# Patient Record
Sex: Male | Born: 1980 | Race: White | Hispanic: No | Marital: Married | Smoking: Current every day smoker
Health system: Southern US, Community
[De-identification: ages and names within clinical notes are randomized; demographics above are authoritative.]

## PROBLEM LIST (undated history)

## (undated) DIAGNOSIS — F319 Bipolar disorder, unspecified: Secondary | ICD-10-CM

## (undated) DIAGNOSIS — F419 Anxiety disorder, unspecified: Secondary | ICD-10-CM

---

## 2020-01-09 ENCOUNTER — Emergency Department (HOSPITAL_COMMUNITY)
Admission: EM | Admit: 2020-01-09 | Discharge: 2020-01-10 | Disposition: A | Discharge status: Home / Self Care | Payer: Self-pay | Attending: Emergency Medicine | Admitting: Emergency Medicine

## 2020-01-09 ENCOUNTER — Other Ambulatory Visit: Payer: Self-pay

## 2020-01-09 ENCOUNTER — Encounter (HOSPITAL_COMMUNITY): Payer: Self-pay | Admitting: Emergency Medicine

## 2020-01-09 ENCOUNTER — Emergency Department (HOSPITAL_COMMUNITY): Payer: Self-pay

## 2020-01-09 DIAGNOSIS — Y9372 Activity, wrestling: Secondary | ICD-10-CM | POA: Insufficient documentation

## 2020-01-09 DIAGNOSIS — F17210 Nicotine dependence, cigarettes, uncomplicated: Secondary | ICD-10-CM | POA: Insufficient documentation

## 2020-01-09 DIAGNOSIS — M25571 Pain in right ankle and joints of right foot: Secondary | ICD-10-CM | POA: Insufficient documentation

## 2020-01-09 HISTORY — DX: Bipolar disorder, unspecified: F31.9

## 2020-01-09 HISTORY — DX: Anxiety disorder, unspecified: F41.9

## 2020-01-09 NOTE — ED Triage Notes (Signed)
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 Denies any injury to the ankle.

## 2020-01-10 NOTE — ED Provider Notes (Signed)
[facility] EMERGENCY DEPARTMENT Provider Note   CSN: [id number] Arrival date & time: 01/09/20  2045   Time seen 11:55 PM  History Chief Complaint  Patient presents with  . Ankle Pain    Jason Turner is a 39 y.o. male.  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  He complains of pain over the lateral malleolus of his right ankle.  He states it is very painful to bear weight.  Patient states he had an old shoulder injury and has an orthopedist he is has seen in [place] before.  PCP Patient, No Pcp Per   Past Medical History:  Diagnosis Date  . Anxiety   . Bipolar 1 disorder (HCC)     There are no problems to display for this patient.    The histories are not reviewed yet. Please review them in the "History" navigator section and refresh this SmartLink.     No family history on file.  Social History   Tobacco Use  . Smoking status: Current Every Day Smoker    Packs/day: 1.00  Substance Use Topics  . Alcohol use: Not Currently  . Drug use: Not on file    Home Medications Prior to Admission medications   Not on File    Allergies    Penicillins  Review of Systems   Review of Systems  All other systems reviewed and are negative.   Physical Exam Updated Vital Signs BP 136/90 (BP Location: Right Arm)   Pulse (!) 111   Temp 98.4 F (36.9 C) (Oral)   Resp 20   Ht 5\' 10"  (1.778 m)   Wt 79.4 kg   SpO2 98%   BMI 25.11 kg/m   Physical Exam Vitals and nursing note reviewed.  Constitutional:      Appearance: Normal appearance. He is normal weight.  HENT:     Head: Normocephalic and atraumatic.     Right Ear: External ear normal.     Left Ear: External ear normal.  Eyes:     Extraocular Movements: Extraocular movements intact.     Conjunctiva/sclera: Conjunctivae normal.     Pupils: Pupils  are equal, round, and reactive to light.  Cardiovascular:     Rate and Rhythm: Normal rate.  Pulmonary:     Effort: Pulmonary effort is normal. No respiratory distress.  Musculoskeletal:        General: Swelling present. No tenderness.     Cervical back: Normal range of motion.     Comments: Patient has marked swelling and bruising around his lateral right ankle with some hematoma seen along the distal lateral foot.  However when I palpate his malleolus or his foot he is nontender.  He states it mainly hurts when he bears weight.  He is nontender in his foot when I palpate the metatarsals.  He states it hurts over the lateral malleolus.  Skin:    General: Skin is warm and dry.  Neurological:     General: No focal deficit present.     Mental Status: He is alert and oriented to person, place, and time.     Cranial Nerves: No cranial nerve deficit.  Psychiatric:        Mood and Affect: Mood normal.        Behavior: Behavior normal.  Thought Content: Thought content normal.       ED Results / Procedures / Treatments   Labs (all labs ordered are listed, but only abnormal results are displayed) Labs Reviewed - No data to display  EKG None  Radiology DG Ankle Complete Right  Result Date: 01/09/2020 CLINICAL DATA:  Pain EXAM: RIGHT ANKLE - COMPLETE 3+ VIEW COMPARISON:  None. FINDINGS: There is no evidence of fracture, dislocation, or joint effusion. There is no evidence of arthropathy or other focal bone abnormality. Soft tissues are unremarkable. IMPRESSION: Negative. Electronically Signed   By: Constance Holster M.D.   On: 01/09/2020 23:14    Procedures Procedures (including critical care time)  Medications Ordered in ED Medications - No data to display  ED Course  I have reviewed the triage vital signs and the nursing notes.  Pertinent labs & imaging results that were available during my care of the patient were reviewed by me and considered in my medical decision  making (see chart for details).    MDM Rules/Calculators/A&P                      Patient was placed in crutches and an ASO was applied.  He states there is an orthopedic group he has seen in [place] before.  They took care of his shoulder sprain that he had.   Final Clinical Impression(s) / ED Diagnoses Final diagnoses:  Acute right ankle pain    Rx / DC Orders ED Discharge Orders    None    OTC ibuprofen and acetaminophen  Plan discharge  Rolland Porter, MD, Barbette Or, MD 01/10/20 [phone]

## 2020-01-10 NOTE — Discharge Instructions (Signed)
Elevate your foot, use ice packs to help with the bruising and swelling, will also help with pain.  Take ibuprofen 600 mg plus acetaminophen 650 mg every 6 hours for pain.  Use the crutches until you are able to bear weight on that leg.  Wear the ankle support until you could be rechecked by an orthopedist.  You can call Dr. Romeo Apple, the orthopedist in [place] or see the orthopedic group in [place] that you saw before.

## 2022-01-01 IMAGING — DX DG ANKLE COMPLETE 3+V*R*
3 series · 3 of 3 positions shown · non-contrast
Comparison: None.

CLINICAL DATA: Pain

EXAM:
RIGHT ANKLE - COMPLETE 3+ VIEW

[ankle ap]
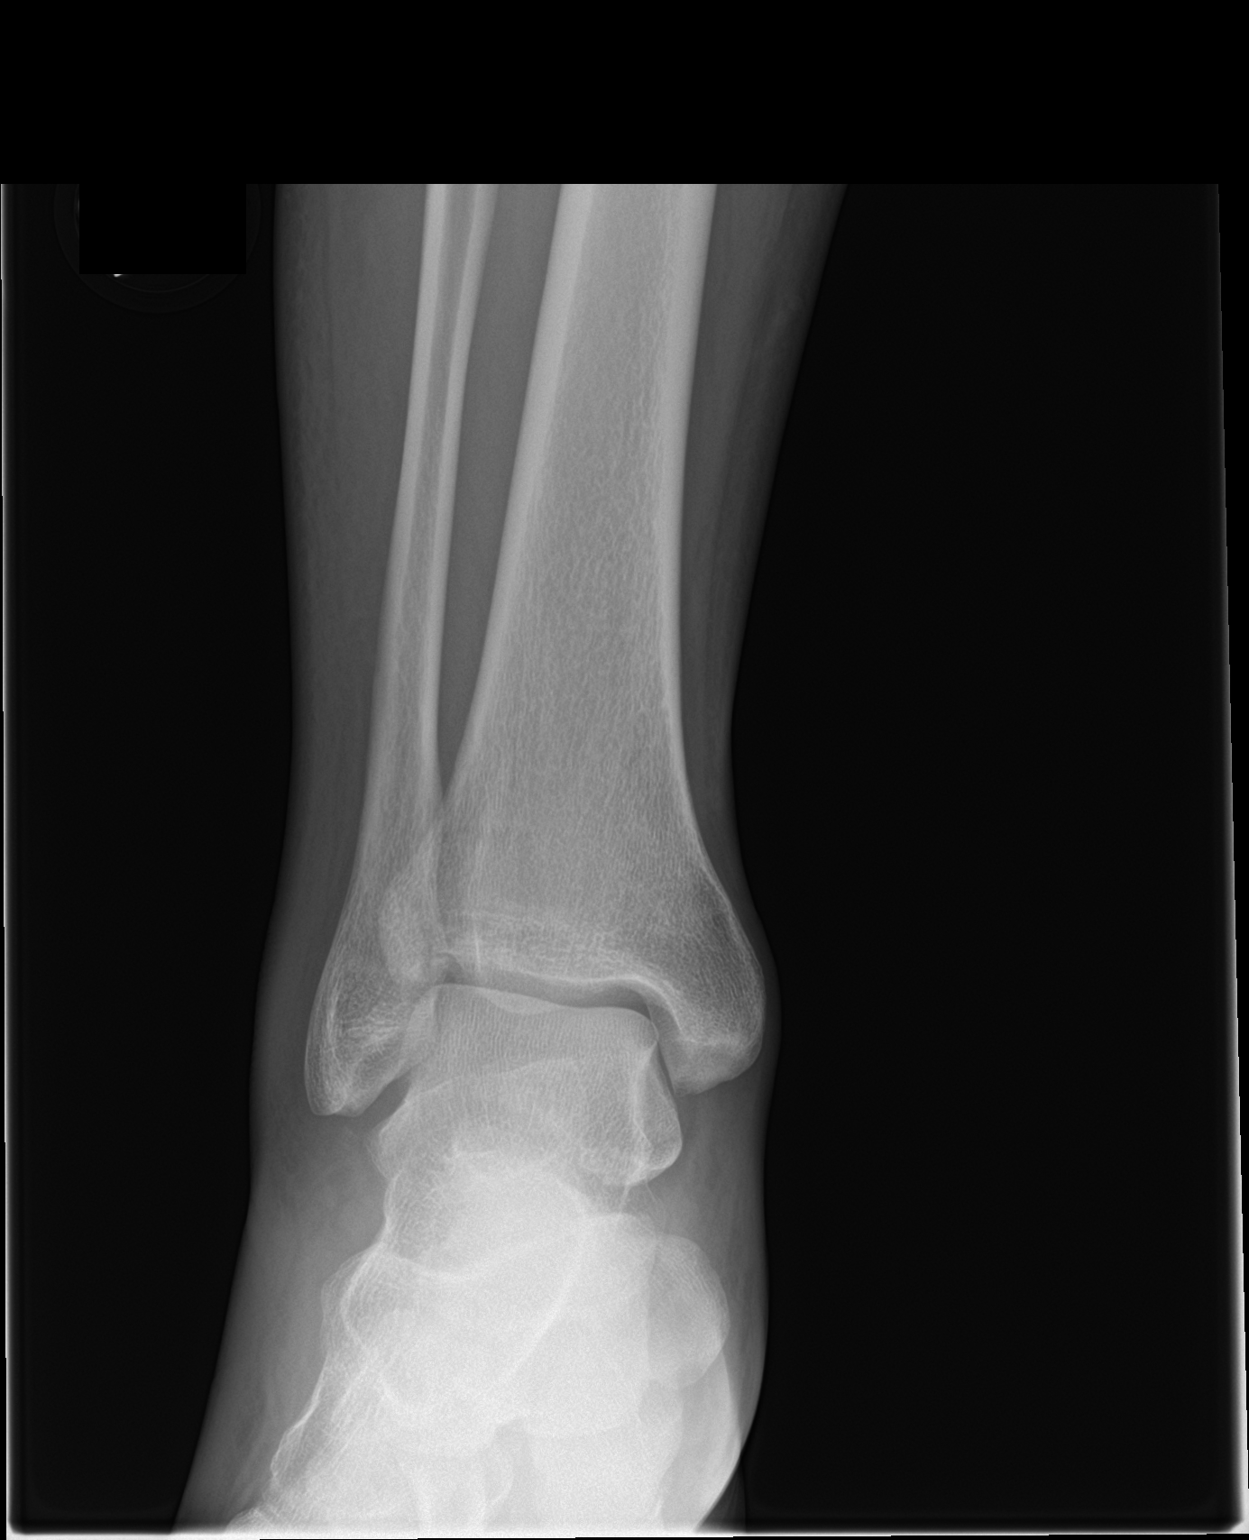

[ankle obl]
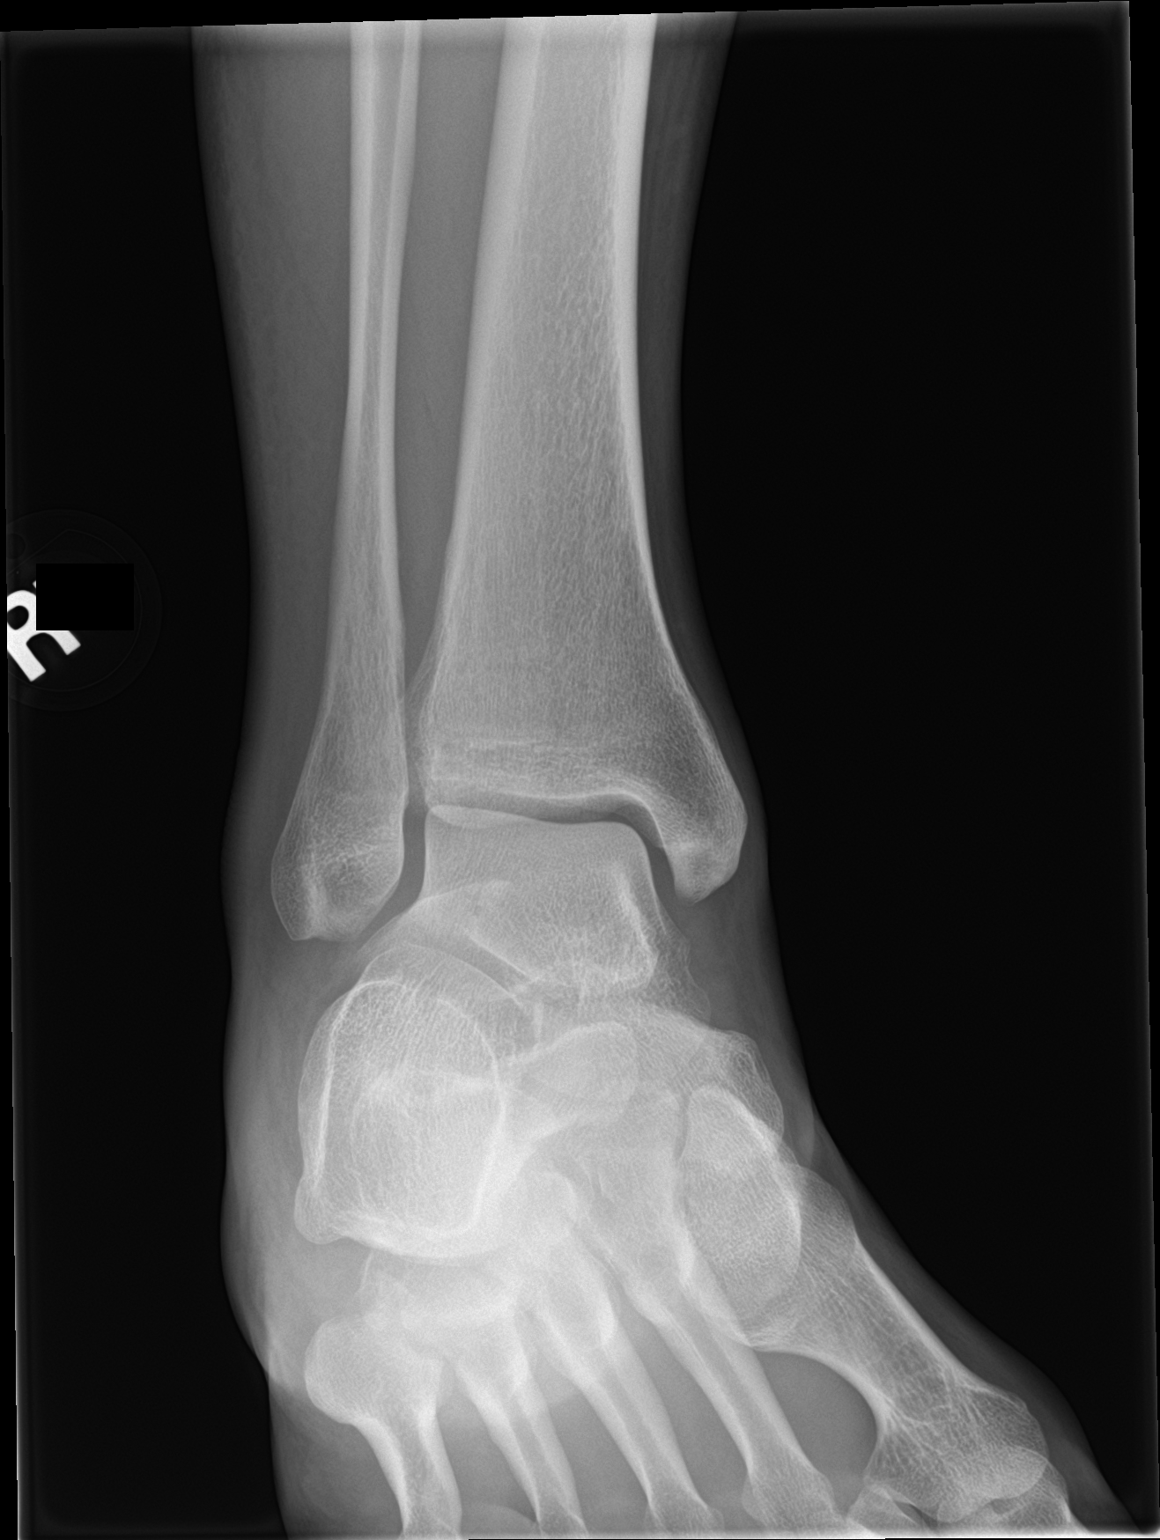

[ankle lat]
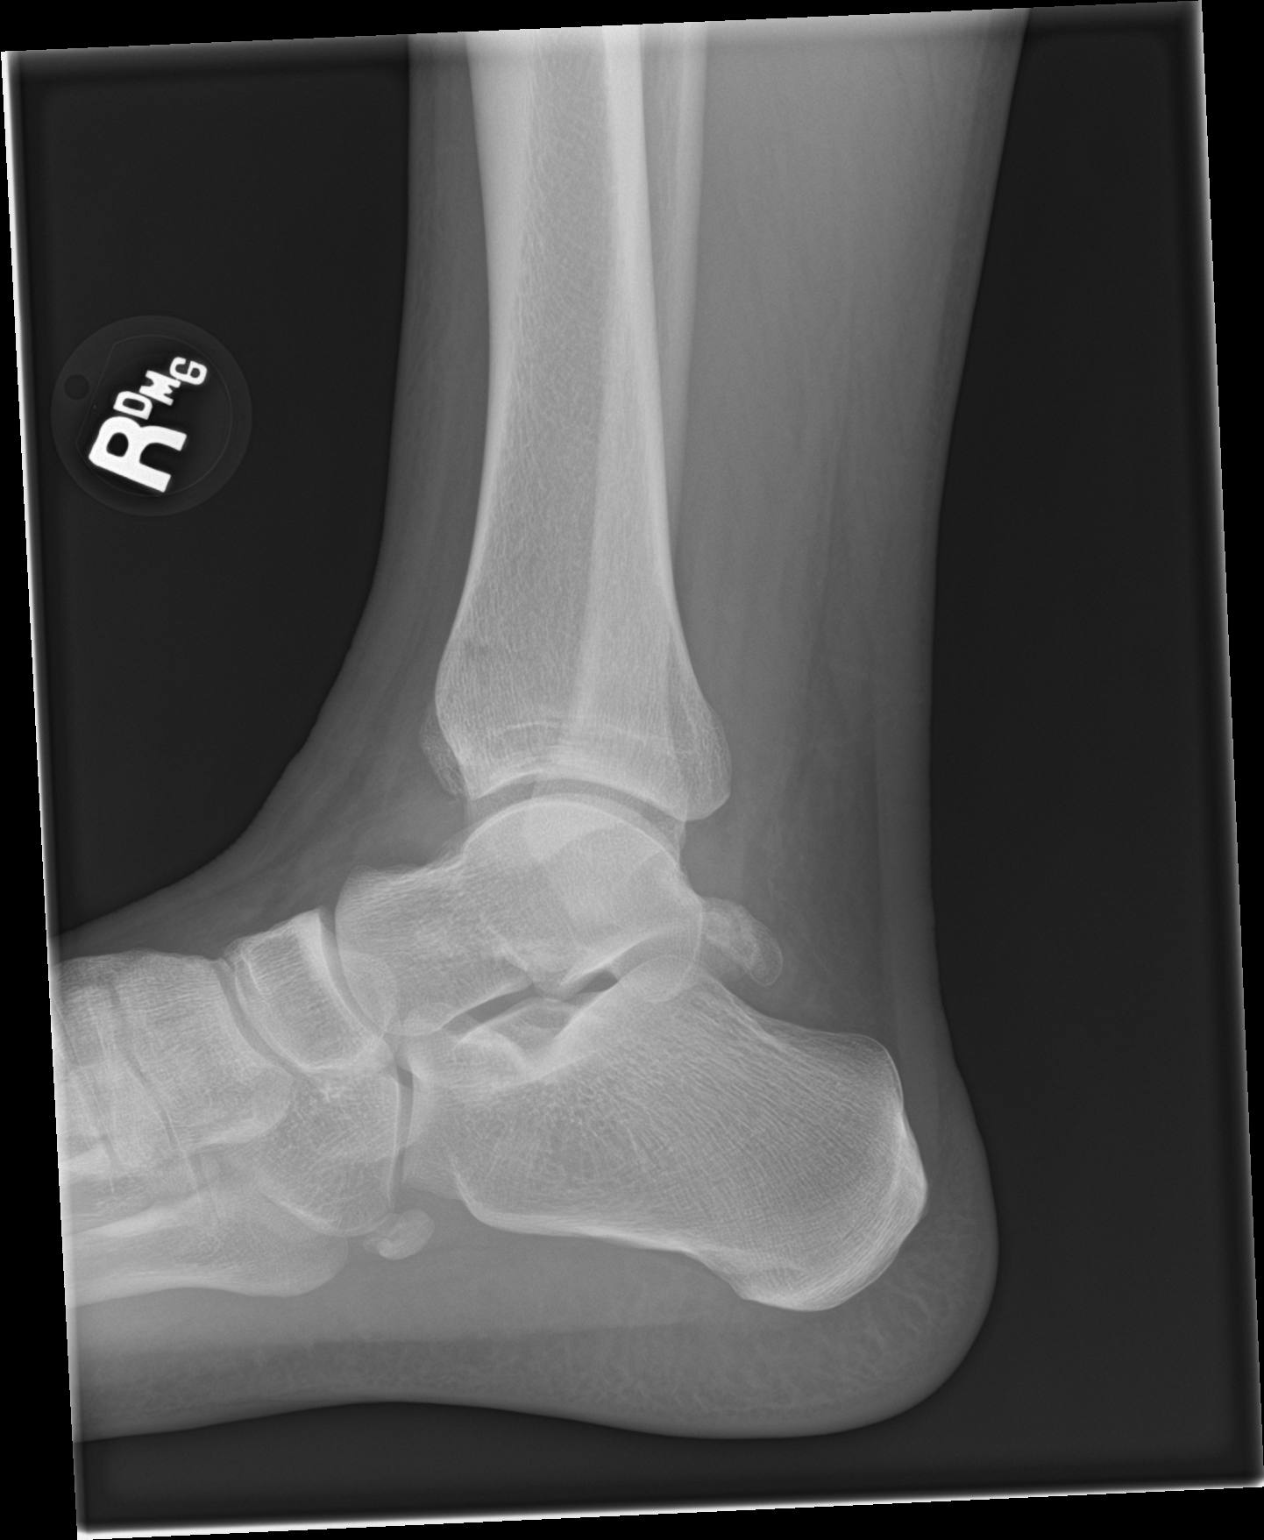

[3 of 3 positions shown; findings below may reference images not displayed]

FINDINGS: There is no evidence of fracture, dislocation, or joint effusion.
There is no evidence of arthropathy or other focal bone abnormality.
Soft tissues are unremarkable.
IMPRESSION: Negative.

## 2023-10-27 ENCOUNTER — Emergency Department: Payer: Self-pay

## 2023-10-27 ENCOUNTER — Emergency Department
Admission: EM | Admit: 2023-10-27 | Discharge: 2023-10-27 | Disposition: A | Discharge status: Home / Self Care | Payer: Self-pay | Attending: Emergency Medicine | Admitting: Emergency Medicine

## 2023-10-27 DIAGNOSIS — J101 Influenza due to other identified influenza virus with other respiratory manifestations: Secondary | ICD-10-CM | POA: Insufficient documentation

## 2023-10-27 DIAGNOSIS — R000 Tachycardia, unspecified: Secondary | ICD-10-CM | POA: Insufficient documentation

## 2023-10-27 LAB — COMPREHENSIVE METABOLIC PANEL
ALT: 16 U/L (ref 0–44)
AST: 20 U/L (ref 15–41)
Albumin: 4.4 g/dL (ref 3.5–5.0)
Alkaline Phosphatase: 50 U/L (ref 38–126)
Anion gap: 8 (ref 5–15)
BUN: 13 mg/dL (ref 6–20)
CO2: 23 mmol/L (ref 22–32)
Calcium: 9.4 mg/dL (ref 8.9–10.3)
Chloride: 103 mmol/L (ref 98–111)
Creatinine, Ser: 1.07 mg/dL (ref 0.61–1.24)
GFR, Estimated: >60 mL/min (ref >60)
Glucose, Bld: 104 mg/dL — ABNORMAL HIGH (ref 70–99)
Potassium: 3.6 mmol/L (ref 3.5–5.1)
Sodium: 134 mmol/L — ABNORMAL LOW (ref 135–145)
Total Bilirubin: 0.8 mg/dL (ref 0.0–1.2)
Total Protein: 7.4 g/dL (ref 6.5–8.1)

## 2023-10-27 LAB — CBC
HCT: 44.3 % (ref 39.0–52.0)
Hemoglobin: 15.3 g/dL (ref 13.0–17.0)
MCH: 29.5 pg (ref 26.0–34.0)
MCHC: 34.5 g/dL (ref 30.0–36.0)
MCV: 85.5 fL (ref 80.0–100.0)
Platelets: 214 10*3/uL (ref 150–400)
RBC: 5.18 MIL/uL (ref 4.22–5.81)
RDW: 13.1 % (ref 11.5–15.5)
WBC: 6.9 10*3/uL (ref 4.0–10.5)
nRBC: 0 % (ref 0.0–0.2)

## 2023-10-27 LAB — MAGNESIUM: Magnesium: 1.7 mg/dL (ref 1.7–2.4)

## 2023-10-27 LAB — RESP PANEL BY RT-PCR (RSV, FLU A&B, COVID)  RVPGX2
Influenza A by PCR: POSITIVE — AB
Influenza B by PCR: NEGATIVE
Resp Syncytial Virus by PCR: NEGATIVE
SARS Coronavirus 2 by RT PCR: NEGATIVE

## 2023-10-27 LAB — TROPONIN I (HIGH SENSITIVITY): Troponin I (High Sensitivity): 4 ng/L (ref <18)

## 2023-10-27 MED ORDER — ACETAMINOPHEN 500 MG PO TABS
1000.0000 mg | ORAL_TABLET | Freq: Once | ORAL | Status: AC
  Administered 2023-10-27: 1000 mg via ORAL
  Filled 2023-10-27: qty 2

## 2023-10-27 MED ORDER — LACTATED RINGERS IV BOLUS
1000.0000 mL | Freq: Once | INTRAVENOUS | Status: AC
  Administered 2023-10-27: 1000 mL via INTRAVENOUS

## 2023-10-27 MED ORDER — IBUPROFEN 400 MG PO TABS
400.0000 mg | ORAL_TABLET | Freq: Once | ORAL | Status: AC
  Administered 2023-10-27: 400 mg via ORAL
  Filled 2023-10-27: qty 1

## 2023-10-27 NOTE — ED Triage Notes (Signed)
 Pt to ED POV from home with tachycardia and generalized weakness for the last few hours. Pt endorses generalized body aches, fevers, and cough/congestion. Denies SOB. A&O x4 with mild labored breathing in triage.

## 2023-10-27 NOTE — ED Provider Notes (Signed)
 [facility] Provider Note    Event Date/Time   First MD Initiated Contact with Patient 10/27/23 [phone]     (approximate)   History   Chief Complaint Tachycardia   HPI  Jason Turner is a 43 y.o. male with past medical history of bipolar disorder and anxiety who presents to the ED complaining tachycardia.  Patient reports that he has been feeling ill since he went to bed last night, when he began experiencing subjective fevers and chills.  He did not check his temperature at home, but states his wife told him he felt very hot to touch.  He has been dealing with a productive cough for the past few days, also reports feeling congested.  He describes achy pain in both of his arms, but denies any pain in his chest or difficulty breathing.  He has not had any nausea, vomiting, diarrhea, or abdominal pain.  He is not aware of any sick contacts.      Physical Exam   Triage Vital Signs: ED Triage Vitals  Encounter Vitals Group     BP 10/27/23 0640 (!) 130/97     Systolic BP Percentile --      Diastolic BP Percentile --      Pulse Rate 10/27/23 0640 (!) 142     Resp 10/27/23 0640 18     Temp 10/27/23 0640 99.2 F (37.3 C)     Temp Source 10/27/23 0640 Oral     SpO2 10/27/23 0640 96 %     Weight 10/27/23 0645 180 lb (81.6 kg)     Height 10/27/23 0645 5\' 10"  (1.778 m)     Head Circumference --      Peak Flow --      Pain Score 10/27/23 0644 6     Pain Loc --      Pain Education --      Exclude from Growth Chart --     Most recent vital signs: Vitals:   10/27/23 1038 10/27/23 1059  BP:    Pulse:  (!) 116  Resp:  20  Temp: 99.3 F (37.4 C)   SpO2:  98%    Constitutional: Alert and oriented. Eyes: Conjunctivae are normal. Head: Atraumatic. Nose: No congestion/rhinnorhea. Mouth/Throat: Mucous membranes are moist.  Cardiovascular: Tachycardic, regular rhythm. Grossly normal heart sounds.  2+ radial pulses bilaterally. Respiratory: Normal  respiratory effort.  No retractions. Lungs CTAB. Gastrointestinal: Soft and nontender. No distention. Musculoskeletal: No lower extremity tenderness nor edema.  Neurologic:  Normal speech and language. No gross focal neurologic deficits are appreciated.    ED Results / Procedures / Treatments   Labs (all labs ordered are listed, but only abnormal results are displayed) Labs Reviewed  RESP PANEL BY RT-PCR (RSV, FLU A&B, COVID)  RVPGX2 - Abnormal; Notable for the following components:      Result Value   Influenza A by PCR POSITIVE (*)    All other components within normal limits  COMPREHENSIVE METABOLIC PANEL - Abnormal; Notable for the following components:   Sodium 134 (*)    Glucose, Bld 104 (*)    All other components within normal limits  CBC  MAGNESIUM  TROPONIN I (HIGH SENSITIVITY)     EKG  ED ECG REPORT I, Chesley Noon, the attending physician, personally viewed and interpreted this ECG.   Date: 10/27/2023  EKG Time: 6:44  Rate: 142  Rhythm: sinus tachycardia  Axis: Normal  Intervals:none  ST&T Change: None  RADIOLOGY Chest x-ray reviewed  and interpreted by me with no infiltrate, edema, or effusion.  PROCEDURES:  Critical Care performed: No  Procedures   MEDICATIONS ORDERED IN ED: Medications  lactated ringers bolus 1,000 mL (0 mLs Intravenous Stopped 10/27/23 1058)  acetaminophen (TYLENOL) tablet 1,000 mg (1,000 mg Oral Given 10/27/23 0725)  lactated ringers bolus 1,000 mL (1,000 mLs Intravenous New Bag/Given 10/27/23 1058)  ibuprofen (ADVIL) tablet 400 mg (400 mg Oral Given 10/27/23 1058)     IMPRESSION / MDM / ASSESSMENT AND PLAN / ED COURSE  I reviewed the triage vital signs and the nursing notes.                              43 y.o. male with past medical history of bipolar disorder and anxiety who presents to the ED complaining of feeling like his heart is racing with subjective fevers and chills and productive cough.  Patient's presentation  is most consistent with acute presentation with potential threat to life or bodily function.  Differential diagnosis includes, but is not limited to, arrhythmia, ACS, anemia, electrolyte abnormality, AKI, viral illness, pneumonia.  Patient nontoxic-appearing and in no acute distress, vital signs are remarkable for tachycardia but otherwise reassuring.  He is not in any respiratory distress and maintaining oxygen saturations at 97% on room air.  EKG shows sinus tachycardia with no ischemic changes, patient with mildly elevated temperature at 99.2 and suspect infectious process given his productive cough.  Viral testing and chest x-ray are pending at this time in addition to labs.  We will give dose of Tylenol and hydrate with IV fluids.  Labs are reassuring with no significant anemia, leukocytosis, lecture abnormality, or AKI.  LFTs are unremarkable and troponin within normal limits, doubt ACS or PE.  Chest x-ray is also unremarkable, patient did test positive for influenza, which is likely the source of his symptoms.  He is feeling better on reassessment, heart rate gradually improving with fluids, however he remains slightly tachycardic.  He was offered further observation in the ED, but declines and is requesting to be discharged home.  He was counseled to return to the ED for new or worsening symptoms, declines Tamiflu.      FINAL CLINICAL IMPRESSION(S) / ED DIAGNOSES   Final diagnoses:  Influenza A  Tachycardia     Rx / DC Orders   ED Discharge Orders     None        Note:  This document was prepared using Dragon voice recognition software and may include unintentional dictation errors.   Chesley Noon, MD 10/27/23 [phone]
